# Patient Record
Sex: Female | Born: 1980 | Race: White | Hispanic: No | Marital: Married | Smoking: Never smoker
Health system: Southern US, Community
[De-identification: ages and names within clinical notes are randomized; demographics above are authoritative.]

## PROBLEM LIST (undated history)

## (undated) DIAGNOSIS — R000 Tachycardia, unspecified: Secondary | ICD-10-CM

## (undated) DIAGNOSIS — J45990 Exercise induced bronchospasm: Secondary | ICD-10-CM

## (undated) DIAGNOSIS — G44209 Tension-type headache, unspecified, not intractable: Secondary | ICD-10-CM

## (undated) DIAGNOSIS — J45909 Unspecified asthma, uncomplicated: Secondary | ICD-10-CM

## (undated) HISTORY — DX: Tension-type headache, unspecified, not intractable: G44.209

## (undated) HISTORY — DX: Exercise induced bronchospasm: J45.990

## (undated) HISTORY — DX: Unspecified asthma, uncomplicated: J45.909

## (undated) HISTORY — DX: Tachycardia, unspecified: R00.0

---

## 2002-11-27 HISTORY — PX: WISDOM TOOTH EXTRACTION: SHX21

## 2005-11-27 DIAGNOSIS — J45990 Exercise induced bronchospasm: Secondary | ICD-10-CM

## 2005-11-27 HISTORY — DX: Exercise induced bronchospasm: J45.990

## 2006-01-21 ENCOUNTER — Emergency Department (HOSPITAL_COMMUNITY): Admission: EM | Admit: 2006-01-21 | Discharge: 2006-01-21 | Payer: Self-pay | Admitting: Emergency Medicine

## 2006-02-20 ENCOUNTER — Other Ambulatory Visit: Admission: RE | Admit: 2006-02-20 | Discharge: 2006-02-20 | Payer: Self-pay | Admitting: Obstetrics & Gynecology

## 2006-11-27 HISTORY — PX: FINGER FRACTURE SURGERY: SHX638

## 2006-12-23 ENCOUNTER — Emergency Department (HOSPITAL_COMMUNITY): Admission: EM | Admit: 2006-12-23 | Discharge: 2006-12-23 | Payer: Self-pay | Admitting: Emergency Medicine

## 2008-01-28 ENCOUNTER — Other Ambulatory Visit: Admission: RE | Admit: 2008-01-28 | Discharge: 2008-01-28 | Payer: Self-pay | Admitting: Obstetrics & Gynecology

## 2009-02-23 ENCOUNTER — Other Ambulatory Visit: Admission: RE | Admit: 2009-02-23 | Discharge: 2009-02-23 | Payer: Self-pay | Admitting: Obstetrics & Gynecology

## 2013-06-10 ENCOUNTER — Encounter: Payer: Self-pay | Admitting: Certified Nurse Midwife

## 2013-06-19 ENCOUNTER — Ambulatory Visit: Payer: Self-pay | Admitting: Certified Nurse Midwife

## 2013-07-01 ENCOUNTER — Ambulatory Visit: Payer: Self-pay | Admitting: Certified Nurse Midwife

## 2013-07-01 ENCOUNTER — Other Ambulatory Visit: Payer: Self-pay | Admitting: Certified Nurse Midwife

## 2013-07-01 NOTE — Telephone Encounter (Signed)
AEX scheduled for 08/12/13 #84/0rf's sent through to last pt until AEX.

## 2013-08-08 ENCOUNTER — Encounter: Payer: Self-pay | Admitting: Certified Nurse Midwife

## 2013-08-11 ENCOUNTER — Encounter: Payer: Self-pay | Admitting: Certified Nurse Midwife

## 2013-08-12 ENCOUNTER — Ambulatory Visit: Payer: Self-pay | Admitting: Certified Nurse Midwife

## 2013-08-13 ENCOUNTER — Encounter: Payer: Self-pay | Admitting: Gynecology

## 2013-08-13 ENCOUNTER — Ambulatory Visit (INDEPENDENT_AMBULATORY_CARE_PROVIDER_SITE_OTHER): Payer: BC Managed Care – PPO | Admitting: Gynecology

## 2013-08-13 VITALS — BP 102/68 | HR 78 | Resp 18 | Ht 65.5 in | Wt 195.0 lb

## 2013-08-13 DIAGNOSIS — Z309 Encounter for contraceptive management, unspecified: Secondary | ICD-10-CM

## 2013-08-13 DIAGNOSIS — Z01419 Encounter for gynecological examination (general) (routine) without abnormal findings: Secondary | ICD-10-CM

## 2013-08-13 DIAGNOSIS — Z0000 Encounter for general adult medical examination without abnormal findings: Secondary | ICD-10-CM

## 2013-08-13 DIAGNOSIS — Z124 Encounter for screening for malignant neoplasm of cervix: Secondary | ICD-10-CM

## 2013-08-13 MED ORDER — LEVONORGESTREL-ETHINYL ESTRAD 0.1-20 MG-MCG PO TABS: 1.0000 | ORAL_TABLET | Freq: Every day | ORAL | Status: DC

## 2013-08-13 NOTE — Progress Notes (Signed)
32 y.o. Married Caucasian female   [id number] here for annual exam. Pt is currently sexually active.  Pt is considering pregnancy for next year.  Pt states that off ocp cycles are regular.  Pt denies dyspareunia.    Patient's last menstrual period was 07/04/2013.          Sexually active: yes  The current method of family planning is OCP (estrogen/progesterone).    Exercising: yes  run, cardio, body pump 2-3 days/wk varies Last pap: 05/25/2010 Alcohol:  Depends  Tobacco: no BSE: no  Hgb:  14.1 ; Urine: unable to void.    Health Maintenance  Topic Date Due  . Pap Smear  03/15/2001  . Influenza Vaccine  06/27/2013  . Tetanus/tdap  06/19/2023    Family History  Problem Relation Age of Onset  . Hypertension Mother   . Heart disease Mother   . Diabetes Father   . Diabetes Maternal Grandmother   . Osteoporosis Paternal Grandmother     There are no active problems to display for this patient.   Past Medical History  Diagnosis Date  . Exercise-induced asthma 2007    Past Surgical History  Procedure Laterality Date  . Finger fracture surgery  2008    Right    Allergies: Azithromycin  Current Outpatient Prescriptions  Medication Sig Dispense Refill  . albuterol (PROAIR HFA) 108 (90 BASE) MCG/ACT inhaler Inhale 2 puffs into the lungs every 6 (six) hours as needed for wheezing.      . Cetirizine HCl (ZYRTEC PO) Take by mouth.      . ORSYTHIA 0.1-20 MG-MCG tablet TAKE 1 TABLET BY MOUTH EVERY DAY  84 tablet  0   No current facility-administered medications for this visit.    ROS: Pertinent items are noted in HPI.  Exam:    BP 102/68  Pulse 78  Resp 18  Ht 5' 5.5" (1.664 m)  Wt 195 lb (88.451 kg)  BMI 31.94 kg/m2  LMP 07/04/2013 Weight change: @WEIGHTCHANGE @ Last 3 height recordings:  Ht Readings from Last 3 Encounters:  08/13/13 5' 5.5" (1.664 m)   General appearance: alert, cooperative and appears stated age Head: Normocephalic, without obvious abnormality,  atraumatic Neck: no adenopathy, no carotid bruit, no JVD, supple, symmetrical, trachea midline and thyroid not enlarged, symmetric, no tenderness/mass/nodules Lungs: clear to auscultation bilaterally Breasts: normal appearance, no masses or tenderness Heart: regular rate and rhythm, S1, S2 normal, no murmur, click, rub or gallop Abdomen: soft, non-tender; bowel sounds normal; no masses,  no organomegaly Extremities: extremities normal, atraumatic, no cyanosis or edema Skin: Skin color, texture, turgor normal. No rashes or lesions Lymph nodes: Cervical, supraclavicular, and axillary nodes normal. no inguinal nodes palpated Neurologic: Grossly normal   Pelvic: External genitalia:  no lesions              Urethra: normal appearing urethra with no masses, tenderness or lesions              Bartholins and Skenes: normal                 Vagina: normal appearing vagina with normal color and discharge, no lesions              Cervix: normal appearance              Pap taken: yes        Bimanual Exam:  Uterus:  uterus is normal size, shape, consistency and nontender  Adnexa:    normal adnexa in size, nontender and no masses                                      Rectovaginal: Confirms                                      Anus:  normal sphincter tone, no lesions  A: well woman no contraindication to continue use of oral contraceptives Contraceptive management     P: pap smear with HRHPV counseled on breast self exam, adequate intake of calcium and vitamin D, diet and exercise return annually or prn   An After Visit Summary was printed and given to the patient.

## 2013-08-14 LAB — RUBELLA SCREEN: Rubella: 11.4 Index — ABNORMAL HIGH (ref <0.90)

## 2013-08-18 ENCOUNTER — Telehealth: Payer: Self-pay | Admitting: *Deleted

## 2013-08-18 LAB — IPS PAP TEST WITH HPV

## 2013-08-18 NOTE — Telephone Encounter (Signed)
Left Message To Call Back  

## 2013-08-18 NOTE — Telephone Encounter (Signed)
Message copied by Lorraine Lax on Mon Aug 18, 2013 10:21 AM ------      Message from: Douglass Rivers      Created: Fri Aug 15, 2013  7:39 PM       Inform imune ------

## 2013-08-21 NOTE — Telephone Encounter (Signed)
Patient notified see labs 

## 2013-12-11 ENCOUNTER — Telehealth: Payer: Self-pay | Admitting: Certified Nurse Midwife

## 2013-12-11 NOTE — Telephone Encounter (Signed)
Spoke with pharmacy and rx was prescribe by Sigmund HazelLisa Miller.   Spoke with pt ans was notified that she has to get medication from PCP as agreed on 01/29/2013. (paper chart)

## 2013-12-11 NOTE — Telephone Encounter (Signed)
Pt is requesting a refill for her inhaler(pro air). She states DL has prescribed this for her before and she only comes here for care.

## 2014-04-15 ENCOUNTER — Ambulatory Visit (INDEPENDENT_AMBULATORY_CARE_PROVIDER_SITE_OTHER): Payer: BC Managed Care – PPO | Admitting: Gynecology

## 2014-04-15 ENCOUNTER — Encounter: Payer: Self-pay | Admitting: Gynecology

## 2014-04-15 VITALS — BP 100/64 | HR 72 | Resp 16 | Ht 65.5 in | Wt 204.0 lb

## 2014-04-15 DIAGNOSIS — Z3169 Encounter for other general counseling and advice on procreation: Secondary | ICD-10-CM

## 2014-04-15 NOTE — Progress Notes (Signed)
Pt here for preconception counseling, she is finishing her last pills this week and is due for menses.  Pt believes that her cycles are regular.  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 Pt repeats cycles before ocp were regular, she denies dyspareunia, or dysmenorrhea.  She has never attempted pregnancy in the past.  Pt reports eating a balanced diet, she does not do regular exercise. Her husband is healthy, no issues with ejaculation.  He is on no medications, has no medical problems such as diabetes or hypertension., he has never fathered a pregnancy.  He has no exposure to chemicals in his job.   We reviewed anticipated ovulation based on 28d cycle, coital frequency, medications and activites to avoid in the 2nd half of her cycle as well as expected fecundity rate based on age. We do not recommend BBT charting or ovulation predictor kits at this point. We would intervene after 7245m of actively trying as long as her cycles are regular, if they become markedly irregluar, she should return before that time. She can take an otc women's vitamin. Questions addressed She was asked to call with +UPT  7979m spent counseling, >50% face to face

## 2014-04-15 NOTE — Patient Instructions (Addendum)
2nd half of cycle-no alcohol, motrin,  hot tubs, saunas Well balanced diet, otc prenatals or women's multi Coital frequency every 3d, no ovulation kits, temp chartingPreparing for Pregnancy Preparing for pregnancy (preconceptual care) by getting counseling and information from your caregiver before getting pregnant is a good idea. It will help you and your baby have a better chance to have a healthy, safe pregnancy and delivery of your baby. Make an appointment with your caregiver to talk about your health, medical, and family history and how to prepare yourself before getting pregnant. Your caregiver will do a complete physical exam and a Pap test. They will want to know:  About you, your spouse or partner, and your family's medical and genetic history.  If you are eating a balanced diet and drinking enough fluids.  What vitamins and mineral supplements you are taking. This includes taking folic acid before getting pregnant to help prevent birth defects.  What medications you are taking including prescription, over-the-counter and herbal medications.  If there is any substance abuse like alcohol, smoking, and illegal drugs.  If there is any mental or physical domestic violence.  If there is any risk of sexually transmitted disease between you and your partner.  What immunizations and vaccinations you have had and what you may need before getting pregnant.  If you should get tested for HIV infection.  If there is any exposure to chemical or toxic substances at home or work.  If there are medical problems you have that need to be treated and kept under control before getting pregnant such as diabetes, high blood pressure or others.  If there were any past surgeries, pregnancies and problems with them.  What your current weight is and to set a goal as to how much weight you should gain while pregnant. Also, they will check if you should lose or gain weight before getting pregnant.  What  is your exercise routine and what it is safe when you are pregnant.  If there are any physical disabilities that need to be addressed.  About spacing your pregnancies when there are other children.  If there is a financial problem that may affect you having a child. After talking about the above points with your caregiver, your caregiver will give you advice on how to help treat and work with you on solving any issues, if necessary, before getting pregnant. The goal is to have a healthy and safe pregnancy for you and your baby. You should keep an accurate record of your menstrual periods because it will help in determining your due date. Immunizations that you should have before getting pregnant:   Regular measles, [place] measles (rubella) and mumps.  Tetanus and diphtheria.  Chickenpox, if not immune.  Herpes zoster (Varicella) if not immune.  Human papilloma virus vaccine (HPV) between the age of 65 and 80 years old.  Hepatitis A vaccine.  Hepatitis B vaccine.  Influenza vaccine.  Pneumococcal vaccine (pneumonia). You should avoid getting pregnant for one month after getting vaccinated with a live virus vaccine such as [place] measles (rubella) which is in the MMR (Measles, Mumps and Rubella) vaccine. Other immunizations may be necessary depending on where you live, such as malaria. Ask your caregiver if any other immunizations are needed for you. HOME CARE INSTRUCTIONS   Follow the advice of your caregiver.  Before getting pregnant:  Begin taking vitamins, supplements, and 0.4 milligrams folic acid daily.  Get your immunizations up-to-date.  Get help from a nutrition counselor if  you do not understand what a balanced diet is, need help with a special medical diet or if you need help to lose or gain weight.  Begin exercising.  Stop smoking, taking illegal drugs, and drinking alcoholic beverages.  Get counseling if there is and type of domestic violence.  Get checked for  sexually transmitted diseases including HIV.  Get any medical problems under control (diabetes, high blood pressure, convulsions, asthma or others).  Resolve any financial concerns or create a plan to do so.  Be sure you and your spouse or partner are ready to have a baby.  Keep an accurate record of your menstrual periods. Document Released: 10/26/2008 Document Revised: 09/03/2013 Document Reviewed: 10/26/2008 [facility] Patient Information 2014 [place].

## 2014-07-03 ENCOUNTER — Encounter: Payer: Self-pay | Admitting: Certified Nurse Midwife

## 2014-07-03 ENCOUNTER — Ambulatory Visit (INDEPENDENT_AMBULATORY_CARE_PROVIDER_SITE_OTHER): Payer: BC Managed Care – PPO | Admitting: Certified Nurse Midwife

## 2014-07-03 VITALS — BP 120/70 | HR 70 | Resp 16 | Ht 65.5 in | Wt 211.0 lb

## 2014-07-03 DIAGNOSIS — N912 Amenorrhea, unspecified: Secondary | ICD-10-CM

## 2014-07-03 DIAGNOSIS — Z3201 Encounter for pregnancy test, result positive: Secondary | ICD-10-CM

## 2014-07-03 LAB — POCT URINE PREGNANCY: Preg Test, Ur: POSITIVE

## 2014-07-03 NOTE — Progress Notes (Signed)
  33 y.o.Married Caucasian female presents with no menses sinceJuly 5. Patient was trying for pregnancy and had stopped contraception. Currently on prenatal vitamins OTC. Patient was taking Zyrtec for allergies and will stop use. Patient had small amount of alcohol earlier in the week before positive pregnancy test. Non smoker. Spouse excited. Denies vaginal bleeding or cramping. Some breast tenderness, no nausea, no vomiting, some fatigue. No problems today.  O: Healthy female WDWN Positive UPT Rubella immune  A: Amenorrhea with positive pregnancy test at 5wk 3 d per LMP on 06/01/14[facility] pregnancy  P:Discussed nutritional needs of pregnancy and foods to avoid. Discussed importance of prenatal care and establish care by 8 - 9 weeks. Given provider list with information. Offered PUS here for viability, patient declined, would rather have done once she starts OB care. Reviewed early pregnancy warning signs and need to evaluate. Questions addressed at length. Patient to call when appointment is scheduled with OB practice.     25 minutes of time spent with patient in face to face counseling.

## 2014-07-04 NOTE — Patient Instructions (Signed)
Prenatal Care  WHAT IS PRENATAL CARE?  Prenatal care means health care during your pregnancy, before your baby is born. It is very important to take care of yourself and your baby during your pregnancy by:   Getting early prenatal care. If you know you are pregnant, or think you might be pregnant, call your health care provider as soon as possible. Schedule a visit for a prenatal exam.  Getting regular prenatal care. Follow your health care provider's schedule for blood and other necessary tests. Do not miss appointments.  Doing everything you can to keep yourself and your baby healthy during your pregnancy.  Getting complete care. Prenatal care should include evaluation of the medical, dietary, educational, psychological, and social needs of you and your significant other. The medical and genetic history of your family and the family of your baby's father should be discussed with your health care provider.  Discussing with your health care provider:  Prescription, over-the-counter, and herbal medicines that you take.  Any history of substance abuse, alcohol use, smoking, and illegal drug use.  Any history of domestic abuse and violence.  Immunizations you have received.  Your nutrition and diet.  The amount of exercise you do.  Any environmental and occupational hazards to which you are exposed.  History of sexually transmitted infections for both you and your partner.  Previous pregnancies you have had. WHY IS PRENATAL CARE SO IMPORTANT?  By regularly seeing your health care provider, you help ensure that problems can be identified early so that they can be treated as soon as possible. Other problems might be prevented. Many studies have shown that early and regular prenatal care is important for the health of mothers and their babies.  HOW CAN I TAKE CARE OF MYSELF WHILE I AM PREGNANT?  Here are ways to take care of yourself and your baby:   Start or continue taking your  multivitamin with 400 micrograms (mcg) of folic acid every day.  Get early and regular prenatal care. It is very important to see a health care provider during your pregnancy. Your health care provider will check at each visit to make sure that you and your baby are healthy. If there are any problems, action can be taken right away to help you and your baby.  Eat a healthy diet that includes:  Fruits.  Vegetables.  Foods low in saturated fat.  Whole grains.  Calcium-rich foods, such as milk, yogurt, and hard cheeses.  Drink 6-8 glasses of liquids a day.  Unless your health care provider tells you not to, try to be physically active for 30 minutes, most days of the week. If you are pressed for time, you can get your activity in through 10-minute segments, three times a day.  Do not smoke, drink alcohol, or use drugs. These can cause long-term damage to your baby. Talk with your health care provider about steps to take to stop smoking. Talk with a member of your faith community, a counselor, a trusted friend, or your health care provider if you are concerned about your alcohol or drug use.  Ask your health care provider before taking any medicine, even over-the-counter medicines. Some medicines are not safe to take during pregnancy.  Get plenty of rest and sleep.  Avoid hot tubs and saunas during pregnancy.  Do not have X-rays taken unless absolutely necessary and with the recommendation of your health care provider. A lead shield can be placed on your abdomen to protect your baby when   X-rays are taken in other parts of your body.  Do not empty the cat litter when you are pregnant. It may contain a parasite that causes an infection called toxoplasmosis, which can cause birth defects. Also, use gloves when working in garden areas used by cats.  Do not eat uncooked or undercooked meats or fish.  Do not eat soft, mold-ripened cheeses (Brie, Camembert, and chevre) or soft, blue-veined  cheese (Danish blue and Roquefort).  Stay away from toxic chemicals like:  Insecticides.  Solvents (some cleaners or paint thinners).  Lead.  Mercury.  Sexual intercourse may continue until the end of the pregnancy, unless you have a medical problem or there is a problem with the pregnancy and your health care provider tells you not to.  Do not wear high-heel shoes, especially during the second half of the pregnancy. You can lose your balance and fall.  Do not take long trips, unless absolutely necessary. Be sure to see your health care provider before going on the trip.  Do not sit in one position for more than 2 hours when on a trip.  Take a copy of your medical records when going on a trip. Know where a hospital is located in the city you are visiting, in case of an emergency.  Most dangerous household products will have pregnancy warnings on their labels. Ask your health care provider about products if you are unsure.  Limit or eliminate your caffeine intake from coffee, tea, sodas, medicines, and chocolate.  Many women continue working through pregnancy. Staying active might help you stay healthier. If you have a question about the safety or the hours you work at your particular job, talk with your health care provider.  Get informed:  Read books.  Watch videos.  Go to childbirth classes for you and your significant other.  Talk with experienced moms.  Ask your health care provider about childbirth education classes for you and your partner. Classes can help you and your partner prepare for the birth of your baby.  Ask about a baby doctor (pediatrician) and methods and pain medicine for labor, delivery, and possible cesarean delivery. HOW OFTEN SHOULD I SEE MY HEALTH CARE PROVIDER DURING PREGNANCY?  Your health care provider will give you a schedule for your prenatal visits. You will have visits more often as you get closer to the end of your pregnancy. An average  pregnancy lasts about 40 weeks.  A typical schedule includes visiting your health care provider:   About once each month during your first 6 months of pregnancy.  Every 2 weeks during the next 2 months.  Weekly in the last month, until the delivery date. Your health care provider will probably want to see you more often if:  You are older than 35 years.  Your pregnancy is high risk because you have certain health problems or problems with the pregnancy, such as:  Diabetes.  High blood pressure.  The baby is not growing on schedule, according to the dates of the pregnancy. Your health care provider will do special tests to make sure you and your baby are not having any serious problems. WHAT HAPPENS DURING PRENATAL VISITS?   At your first prenatal visit, your health care provider will do a physical exam and talk to you about your health history and the health history of your partner and your family. Your health care provider will be able to tell you what date to expect your baby to be born on.  Your   first physical exam will include checks of your blood pressure, measurements of your height and weight, and an exam of your pelvic organs. Your health care provider will do a Pap test if you have not had one recently and will do cultures of your cervix to make sure there is no infection.  At each prenatal visit, there will be tests of your blood, urine, blood pressure, weight, and the progress of the baby will be checked.  At your later prenatal visits, your health care provider will check how you are doing and how your baby is developing. You may have a number of tests done as your pregnancy progresses.  Ultrasound exams are often used to check on your baby's growth and health.  You may have more urine and blood tests, as well as special tests, if needed. These may include amniocentesis to examine fluid in the pregnancy sac, stress tests to check how the baby responds to contractions, or a  biophysical profile to measure your baby's well-being. Your health care provider will explain the tests and why they are necessary.  You should be tested for high blood sugar (gestational diabetes) between the 24th and 28th weeks of your pregnancy.  You should discuss with your health care provider your plans to breastfeed or bottle-feed your baby.  Each visit is also a chance for you to learn about staying healthy during pregnancy and to ask questions. Document Released: 11/16/2003 Document Revised: 11/18/2013 Document Reviewed: 01/28/2014 [employer] Patient Information 2015 [employer]. This information is not intended to replace advice given to you by your health care provider. Make sure you discuss any questions you have with your health care provider.  

## 2014-07-06 NOTE — Progress Notes (Signed)
Reviewed personally.  M. Suzanne Minka Knight, MD.  

## 2014-07-07 ENCOUNTER — Telehealth: Payer: Self-pay | Admitting: Gynecology

## 2014-07-07 NOTE — Telephone Encounter (Signed)
Pt calling with questions about allergy medications since she had a positive pregnancy test.

## 2014-07-07 NOTE — Telephone Encounter (Signed)
Spoke with patient. Patient states that when she was seen with Linda Norton she was told to stop taking Zyrtec. Patient states "[place] am fine but I came up to [place] and my allergies are really bad." Patient has been using nasal saline as suggested but would like to know what else she is able to do. "I want to know what to take and is it okay. I know it isn't good for you but if I had to take something what is best?" Advised patient would send a message to Linda Norton CNM and give patient a call back with further recommendations/instructions. Patient agreeable.

## 2014-07-07 NOTE — Telephone Encounter (Signed)
Spoke with patient. Advised of message as seen below from Deborah S. Leonard CNM. Patient is agreeable and verbalizes understanding.  Routing to provider for final review. Patient agreeable to disposition. Will close encounter   

## 2014-07-07 NOTE — Telephone Encounter (Signed)
Patient can try children's Claritin chewable per OTC instructions to see if this will help.

## 2014-07-30 LAB — OB RESULTS CONSOLE ABO/RH: RH Type: POSITIVE

## 2014-07-30 LAB — OB RESULTS CONSOLE HIV ANTIBODY (ROUTINE TESTING): HIV: NONREACTIVE

## 2014-07-30 LAB — OB RESULTS CONSOLE HEPATITIS B SURFACE ANTIGEN: HEP B S AG: NEGATIVE

## 2014-07-30 LAB — OB RESULTS CONSOLE RPR: RPR: NONREACTIVE

## 2014-07-30 LAB — OB RESULTS CONSOLE GC/CHLAMYDIA
CHLAMYDIA, DNA PROBE: NEGATIVE
Gonorrhea: NEGATIVE

## 2014-09-18 ENCOUNTER — Telehealth: Payer: Self-pay | Admitting: Gynecology

## 2014-09-18 NOTE — Telephone Encounter (Signed)
Routing to Dr.Lathrop as FYI.  Routing to provider for final review. Patient agreeable to disposition. Will close encounter

## 2014-09-18 NOTE — Telephone Encounter (Signed)
Pt is calling because she forgot to let dr lathrop know that [facility] and is seeing Dr Vickey SagesAtkins.

## 2014-09-28 ENCOUNTER — Encounter: Payer: Self-pay | Admitting: Certified Nurse Midwife

## 2014-11-27 NOTE — L&D Delivery Note (Signed)
Delivery Note At 11:02 PM a viable female was delivered via Vaginal, Spontaneous Delivery (Presentation: ; Occiput Anterior).  APGAR: , ; weight  .   Placenta status: Intact, Spontaneous.  Cord:  with the following complications: None.  Cord pH: not sent   Anesthesia: Local  Episiotomy: None Lacerations: 2nd degree Suture Repair: 3.0 vicryl rapide Est. Blood Loss (mL):  300  Mom to postpartum.  Baby to Couplet care / Skin to Skin.  Meriel PicaHOLLAND,Kasch Borquez M 02/26/2015, 11:23 PM

## 2015-02-01 LAB — OB RESULTS CONSOLE GBS: GBS: NEGATIVE

## 2015-02-26 ENCOUNTER — Inpatient Hospital Stay (HOSPITAL_COMMUNITY)
Admission: AD | Admit: 2015-02-26 | Discharge: 2015-02-28 | DRG: 775 | Disposition: A | Discharge status: Home / Self Care | Payer: BC Managed Care – PPO | Source: Ambulatory Visit | Attending: Obstetrics & Gynecology | Admitting: Obstetrics & Gynecology

## 2015-02-26 ENCOUNTER — Encounter (HOSPITAL_COMMUNITY): Payer: Self-pay

## 2015-02-26 DIAGNOSIS — Z833 Family history of diabetes mellitus: Secondary | ICD-10-CM

## 2015-02-26 DIAGNOSIS — Z3A40 40 weeks gestation of pregnancy: Secondary | ICD-10-CM | POA: Diagnosis present

## 2015-02-26 DIAGNOSIS — Z8249 Family history of ischemic heart disease and other diseases of the circulatory system: Secondary | ICD-10-CM

## 2015-02-26 DIAGNOSIS — IMO0001 Reserved for inherently not codable concepts without codable children: Secondary | ICD-10-CM

## 2015-02-26 DIAGNOSIS — O9989 Other specified diseases and conditions complicating pregnancy, childbirth and the puerperium: Secondary | ICD-10-CM | POA: Diagnosis present

## 2015-02-26 LAB — TYPE AND SCREEN
ABO/RH(D): O POS
Antibody Screen: NEGATIVE

## 2015-02-26 LAB — CBC
HEMATOCRIT: 38.7 % (ref 36.0–46.0)
HEMOGLOBIN: 13.1 g/dL (ref 12.0–15.0)
MCH: 28.1 pg (ref 26.0–34.0)
MCHC: 33.9 g/dL (ref 30.0–36.0)
MCV: 82.9 fL (ref 78.0–100.0)
Platelets: 226 10*3/uL (ref 150–400)
RBC: 4.67 MIL/uL (ref 3.87–5.11)
RDW: 15.4 % (ref 11.5–15.5)
WBC: 11.8 10*3/uL — ABNORMAL HIGH (ref 4.0–10.5)

## 2015-02-26 LAB — RPR: RPR Ser Ql: NONREACTIVE

## 2015-02-26 LAB — ABO/RH: ABO/RH(D): O POS

## 2015-02-26 MED ORDER — LACTATED RINGERS IV SOLN
INTRAVENOUS | Status: DC
  Administered 2015-02-26: 18:00:00 via INTRAVENOUS

## 2015-02-26 MED ORDER — BUTORPHANOL TARTRATE 1 MG/ML IJ SOLN
INTRAMUSCULAR | Status: AC
  Administered 2015-02-26: 1 mg
  Filled 2015-02-26: qty 1

## 2015-02-26 MED ORDER — FLEET ENEMA 7-19 GM/118ML RE ENEM: 1.0000 | ENEMA | RECTAL | Status: DC | PRN

## 2015-02-26 MED ORDER — CITRIC ACID-SODIUM CITRATE 334-500 MG/5ML PO SOLN
30.0000 mL | ORAL | Status: DC | PRN
Start: 2015-02-26 — End: 2015-02-27

## 2015-02-26 MED ORDER — OXYTOCIN 40 UNITS IN LACTATED RINGERS INFUSION - SIMPLE MED
1.0000 m[IU]/min | INTRAVENOUS | Status: DC
  Administered 2015-02-26: 2 m[IU]/min via INTRAVENOUS
  Filled 2015-02-26: qty 1000

## 2015-02-26 MED ORDER — TERBUTALINE SULFATE 1 MG/ML IJ SOLN: 0.2500 mg | Freq: Once | INTRAMUSCULAR | Status: AC | PRN

## 2015-02-26 MED ORDER — LACTATED RINGERS IV SOLN: 500.0000 mL | INTRAVENOUS | Status: DC | PRN

## 2015-02-26 MED ORDER — ONDANSETRON HCL 4 MG/2ML IJ SOLN: 4.0000 mg | Freq: Four times a day (QID) | INTRAMUSCULAR | Status: DC | PRN

## 2015-02-26 MED ORDER — BUTORPHANOL TARTRATE 1 MG/ML IJ SOLN: 1.0000 mg | Freq: Once | INTRAMUSCULAR | Status: DC

## 2015-02-26 MED ORDER — ACETAMINOPHEN 325 MG PO TABS
650.0000 mg | ORAL_TABLET | ORAL | Status: DC | PRN
Start: 2015-02-26 — End: 2015-02-27

## 2015-02-26 MED ORDER — OXYTOCIN 40 UNITS IN LACTATED RINGERS INFUSION - SIMPLE MED: 62.5000 mL/h | INTRAVENOUS | Status: DC

## 2015-02-26 MED ORDER — LIDOCAINE HCL (PF) 1 % IJ SOLN
30.0000 mL | INTRAMUSCULAR | Status: DC | PRN
  Administered 2015-02-26: 30 mL via SUBCUTANEOUS
  Filled 2015-02-26: qty 30

## 2015-02-26 MED ORDER — OXYCODONE-ACETAMINOPHEN 5-325 MG PO TABS: 1.0000 | ORAL_TABLET | ORAL | Status: DC | PRN

## 2015-02-26 MED ORDER — OXYTOCIN BOLUS FROM INFUSION
500.0000 mL | INTRAVENOUS | Status: DC
  Administered 2015-02-26: 500 mL via INTRAVENOUS

## 2015-02-26 MED ORDER — OXYCODONE-ACETAMINOPHEN 5-325 MG PO TABS
2.0000 | ORAL_TABLET | ORAL | Status: DC | PRN
Start: 2015-02-26 — End: 2015-02-27

## 2015-02-26 NOTE — Progress Notes (Signed)
Dr. Marcelle OverlieHolland called and notified of pt request to hold Pitocin, notified pt requesting to use breast pump to stimulate uc's,

## 2015-02-26 NOTE — MAU Note (Signed)
PT  SAYS SHE AWOKE AT 0230  AND PANTS  WERE WET.   VE IN  OFFICE  3 CM.   DENIES HSV AND MRSA.  GBS- NEG  FEELS  SOME UC

## 2015-02-26 NOTE — H&P (Signed)
Linda DupontChristina Norton is a 34 y.o. female presenting for SROM/labor. Maternal Medical History:  Reason for admission: Rupture of membranes.   Contractions: Onset was 3-5 hours ago.   Frequency: irregular.   Perceived severity is moderate.    Fetal activity: Perceived fetal activity is normal.   Last perceived fetal movement was within the past hour.      OB History    Gravida Para Term Preterm AB TAB SAB Ectopic Multiple Living   1 0 0 0 0 0 0 0 0 0      Past Medical History  Diagnosis Date  . Exercise-induced asthma 2007   Past Surgical History  Procedure Laterality Date  . Finger fracture surgery  2008    Right   Family History: family history includes Diabetes in her father and maternal grandmother; Heart disease in her mother; Hypertension in her mother; Osteoporosis in her paternal grandmother. Social History:  reports that she has never smoked. She has never used smokeless tobacco. She reports that she does not drink alcohol or use illicit drugs.   Prenatal Transfer Tool  Maternal Diabetes: No Genetic Screening: Normal Maternal Ultrasounds/Referrals: Normal Fetal Ultrasounds or other Referrals:  None Maternal Substance Abuse:  No Significant Maternal Medications:  None Significant Maternal Lab Results:  None Other Comments:  None  ROS  Dilation: 4 Effacement (%): 70 Exam by:: Lucas MallowKlashley, RN Height 5\' 5"  (1.651 m), weight 255 lb (115.667 kg), last menstrual period 06/01/2014. Exam Physical Exam  Constitutional: She is oriented to person, place, and time. She appears well-developed and well-nourished.  HENT:  Head: Normocephalic and atraumatic.  Neck: Normal range of motion. Neck supple.  Cardiovascular: Normal rate and regular rhythm.   Respiratory: Effort normal and breath sounds normal.  GI:  Term FH, FHR 148  Genitourinary:  4/vtx/clr AF  Musculoskeletal: Normal range of motion.  Neurological: She is alert and oriented to person, place, and time.     Prenatal labs: ABO, Rh: O/Positive/-- (09/03 0000) Antibody:   Rubella:   RPR: Nonreactive (09/03 0000)  HBsAg: Negative (09/03 0000)  HIV: Non-reactive (09/03 0000)  GBS: Negative (03/07 0000)   Assessment/Plan: Term IUP, SROM + early labor   Derico Mitton M 02/26/2015, 7:29 AM

## 2015-02-26 NOTE — Progress Notes (Signed)
Called by RN re hypotonic labor pattern, rec pit per protocol, pt declines

## 2015-02-26 NOTE — Progress Notes (Signed)
Reported to Dr. Marcelle OverlieHolland that patient has UCs every 1.5 to 2.5 min with little resting time in between. Dr. Marcelle OverlieHolland ordered to turn pitocin off and maintain continuous fetal monitoring.

## 2015-02-26 NOTE — MAU Note (Signed)
Leaking fluid, unsure if still leaking. Bloody show. Mild abdominal cramping. Positive fetal movement. Dilated 3 cm in office yesterday.

## 2015-02-26 NOTE — Progress Notes (Signed)
Tele EFM monitor applied for patient to ambulate in hallway and room; birthing ball also brought to room per patient request; no other questions or concerns at this time

## 2015-02-27 ENCOUNTER — Encounter (HOSPITAL_COMMUNITY): Payer: Self-pay | Admitting: *Deleted

## 2015-02-27 LAB — CBC
HCT: 33.5 % — ABNORMAL LOW (ref 36.0–46.0)
Hemoglobin: 11.3 g/dL — ABNORMAL LOW (ref 12.0–15.0)
MCH: 27.8 pg (ref 26.0–34.0)
MCHC: 33.7 g/dL (ref 30.0–36.0)
MCV: 82.5 fL (ref 78.0–100.0)
Platelets: 198 10*3/uL (ref 150–400)
RBC: 4.06 MIL/uL (ref 3.87–5.11)
RDW: 15.5 % (ref 11.5–15.5)
WBC: 16.1 10*3/uL — ABNORMAL HIGH (ref 4.0–10.5)

## 2015-02-27 MED ORDER — SENNOSIDES-DOCUSATE SODIUM 8.6-50 MG PO TABS
2.0000 | ORAL_TABLET | ORAL | Status: DC
  Administered 2015-02-27: 2 via ORAL
  Filled 2015-02-27: qty 2

## 2015-02-27 MED ORDER — BISACODYL 10 MG RE SUPP: 10.0000 mg | Freq: Every day | RECTAL | Status: DC | PRN

## 2015-02-27 MED ORDER — MEASLES, MUMPS & RUBELLA VAC ~~LOC~~ INJ
0.5000 mL | INJECTION | Freq: Once | SUBCUTANEOUS | Status: DC
  Filled 2015-02-27: qty 0.5

## 2015-02-27 MED ORDER — ACETAMINOPHEN 325 MG PO TABS: 650.0000 mg | ORAL_TABLET | ORAL | Status: DC | PRN

## 2015-02-27 MED ORDER — PRENATAL MULTIVITAMIN CH
1.0000 | ORAL_TABLET | Freq: Every day | ORAL | Status: DC
  Filled 2015-02-27: qty 1

## 2015-02-27 MED ORDER — ONDANSETRON HCL 4 MG PO TABS: 4.0000 mg | ORAL_TABLET | ORAL | Status: DC | PRN

## 2015-02-27 MED ORDER — DIBUCAINE 1 % RE OINT: 1.0000 "application " | TOPICAL_OINTMENT | RECTAL | Status: DC | PRN

## 2015-02-27 MED ORDER — IBUPROFEN 800 MG PO TABS
800.0000 mg | ORAL_TABLET | Freq: Three times a day (TID) | ORAL | Status: DC | PRN
  Administered 2015-02-27: 800 mg via ORAL
  Filled 2015-02-27: qty 1

## 2015-02-27 MED ORDER — SIMETHICONE 80 MG PO CHEW: 80.0000 mg | CHEWABLE_TABLET | ORAL | Status: DC | PRN

## 2015-02-27 MED ORDER — OXYCODONE-ACETAMINOPHEN 5-325 MG PO TABS: 2.0000 | ORAL_TABLET | ORAL | Status: DC | PRN

## 2015-02-27 MED ORDER — OXYCODONE-ACETAMINOPHEN 5-325 MG PO TABS: 1.0000 | ORAL_TABLET | ORAL | Status: DC | PRN

## 2015-02-27 MED ORDER — BENZOCAINE-MENTHOL 20-0.5 % EX AERO
1.0000 "application " | INHALATION_SPRAY | CUTANEOUS | Status: DC | PRN
  Administered 2015-02-27: 1 via TOPICAL
  Filled 2015-02-27: qty 56

## 2015-02-27 MED ORDER — ALBUTEROL SULFATE (2.5 MG/3ML) 0.083% IN NEBU: 3.0000 mL | INHALATION_SOLUTION | Freq: Four times a day (QID) | RESPIRATORY_TRACT | Status: DC | PRN

## 2015-02-27 MED ORDER — WITCH HAZEL-GLYCERIN EX PADS: 1.0000 "application " | MEDICATED_PAD | CUTANEOUS | Status: DC | PRN

## 2015-02-27 MED ORDER — FLEET ENEMA 7-19 GM/118ML RE ENEM: 1.0000 | ENEMA | Freq: Every day | RECTAL | Status: DC | PRN

## 2015-02-27 MED ORDER — DIPHENHYDRAMINE HCL 25 MG PO CAPS: 25.0000 mg | ORAL_CAPSULE | Freq: Four times a day (QID) | ORAL | Status: DC | PRN

## 2015-02-27 MED ORDER — LANOLIN HYDROUS EX OINT: TOPICAL_OINTMENT | CUTANEOUS | Status: DC | PRN

## 2015-02-27 MED ORDER — ZOLPIDEM TARTRATE 5 MG PO TABS: 5.0000 mg | ORAL_TABLET | Freq: Every evening | ORAL | Status: DC | PRN

## 2015-02-27 MED ORDER — TETANUS-DIPHTH-ACELL PERTUSSIS 5-2.5-18.5 LF-MCG/0.5 IM SUSP
0.5000 mL | Freq: Once | INTRAMUSCULAR | Status: DC
Start: 2015-02-27 — End: 2015-02-27

## 2015-02-27 MED ORDER — ONDANSETRON HCL 4 MG/2ML IJ SOLN: 4.0000 mg | INTRAMUSCULAR | Status: DC | PRN

## 2015-02-27 NOTE — Lactation Note (Signed)
This note was copied from the chart of Boy Zakara Vasudevan. Lactation Consultation Note  Patient Name: Boy Terrence DupontChristina Bazar [id number]'s Date: 02/27/2015 Reason for consult: Initial assessment   Initial consult at 21 hours old; GA 38.5; BW 7#,0oz; voids-1; stools-2 since birth.  Mom reports feeding on left side for 30+ minutes prior to switching to right. Infant was on right breast when LC entered room semi-shallow latch.  Encouraged mom to break seal and re-latch.  Taught sandwiching of breast and asymmetrical latching technique.  Taught dad how to assist using tea cup hold and taught both parents how to flange bottom lip.  Infant kept tucking bottom lip.  Infant fed with a consistent pattern with deep latch and deep sucks, few swallows heard.  Taught hand expressions with return demonstration and observation of colostrum.  Mom was encouraged when she saw the colostrum and when we heard the swallows.  Lots basic teaching done.  Infant has been feeding for the past hour.  Twice noted strings of milk on nipple when infant came off breast during consult.    Encouraged parents to keep feeding with feeding cues.  Educated on cluster feeding, size of infant's stomach, and encouraged continued exclusive breastfeeding.  Educated on supply and demand and breast care.   Mom asked questions about pumping and building a milk reserve in freezer for returning to work in 4 months.  (Mom is a Runner, broadcasting/film/videoteacher).  Mom has a DEBP from a friend she is borrowing while she waits for her pump from insurance company to arrive. Mom has only been feeding in football position.  Encouraged mom to try cross-cradle position prior to discharge.  Discharge anticipated for tomorrow.   Lactation brochure given and informed of outpatient consults and hospital support group.  Encouraged to call for assistance with feedings as needed.     Maternal Data Has patient been taught Hand Expression?: Yes (colostrum observed with return demonstration) Does  the patient have breastfeeding experience prior to this delivery?: No  Feeding Feeding Type: Breast Fed Length of feed: 75 min  LATCH Score/Interventions Latch: Grasps breast easily, tongue down, lips flanged, rhythmical sucking. Intervention(s): Breast compression  Audible Swallowing: A few with stimulation Intervention(s): Skin to skin;Alternate breast massage  Type of Nipple: Everted at rest and after stimulation  Comfort (Breast/Nipple): Soft / non-tender     Hold (Positioning): No assistance needed to correctly position infant at breast. Intervention(s): Breastfeeding basics reviewed;Support Pillows;Skin to skin (encouraged mom to try cross-cradle position prior to discharge)  LATCH Score: 9  Lactation Tools Discussed/Used WIC Program: No   Consult Status Consult Status: Follow-up Date: 02/28/15 Follow-up type: In-patient    Lendon KaVann, Khalidah Herbold Walker 02/27/2015, 8:22 PM

## 2015-02-27 NOTE — Plan of Care (Signed)
Problem: Consults Goal: Birthing Suites Patient Information Press F2 to bring up selections list  Outcome: Completed/Met Date Met:  02/27/15  Pt 37-[redacted] weeks EGA  Problem: Phase II Progression Outcomes Goal: Pushing aids - rope, bar, perineal massage Outcome: Completed/Met Date Met:  02/27/15 Hot compress  On perineum

## 2015-02-27 NOTE — Progress Notes (Signed)
Post Partum Day 1 Subjective: no complaints  Objective: Blood pressure 111/58, pulse 82, temperature 98 F (36.7 C), temperature source Oral, resp. rate 18, height 5\' 5"  (1.651 m), weight 255 lb (115.667 kg), last menstrual period 06/01/2014, unknown if currently breastfeeding.  Physical Exam:  General: alert Lochia: appropriate Uterine Fundus: firm Incision: healing well DVT Evaluation: No evidence of DVT seen on physical exam.   Recent Labs  02/26/15 0530 02/27/15 0555  HGB 13.1 11.3*  HCT 38.7 33.5*    Assessment/Plan: Plan for discharge tomorrow   LOS: 1 day   Candido Flott M 02/27/2015, 9:18 AM

## 2015-02-28 NOTE — Discharge Summary (Signed)
Obstetric Discharge Summary Reason for Admission: onset of labor Prenatal Procedures: none Intrapartum Procedures: spontaneous vaginal delivery Postpartum Procedures: none Complications-Operative and Postpartum: none HEMOGLOBIN  Date Value Ref Range Status  02/27/2015 11.3* 12.0 - 15.0 g/dL Final   HCT  Date Value Ref Range Status  02/27/2015 33.5* 36.0 - 46.0 % Final    Physical Exam:  General: alert Lochia: appropriate Uterine Fundus: firm Incision: healing well DVT Evaluation: No evidence of DVT seen on physical exam.  Discharge Diagnoses: Term Pregnancy-delivered  Discharge Information: Date: 02/28/2015 Activity: pelvic rest Diet: routine Medications: PNV Condition: stable Instructions: refer to practice specific booklet Discharge to: home Follow-up Information    Follow up with Meriel PicaHOLLAND,Simora Dingee M, MD. Schedule an appointment as soon as possible for a visit in 6 weeks.   Specialty:  Obstetrics and Gynecology   Contact information:   [address] [place] [id number] [phone]       Newborn Data: Live born female  Birth Weight: 7 lb (3175 g) APGAR: 9, 9  Home with mother.  Meriel PicaHOLLAND,Shakirah Kirkey M 02/28/2015, 9:31 AM

## 2018-03-15 ENCOUNTER — Emergency Department (HOSPITAL_BASED_OUTPATIENT_CLINIC_OR_DEPARTMENT_OTHER): Payer: BC Managed Care – PPO

## 2018-03-15 ENCOUNTER — Other Ambulatory Visit: Payer: Self-pay

## 2018-03-15 ENCOUNTER — Encounter (HOSPITAL_BASED_OUTPATIENT_CLINIC_OR_DEPARTMENT_OTHER): Payer: Self-pay | Admitting: *Deleted

## 2018-03-15 ENCOUNTER — Emergency Department (HOSPITAL_BASED_OUTPATIENT_CLINIC_OR_DEPARTMENT_OTHER)
Admission: EM | Admit: 2018-03-15 | Discharge: 2018-03-15 | Disposition: A | Discharge status: Home / Self Care | Payer: BC Managed Care – PPO | Attending: Physician Assistant | Admitting: Physician Assistant

## 2018-03-15 DIAGNOSIS — M7918 Myalgia, other site: Secondary | ICD-10-CM

## 2018-03-15 DIAGNOSIS — S93401A Sprain of unspecified ligament of right ankle, initial encounter: Secondary | ICD-10-CM | POA: Diagnosis not present

## 2018-03-15 DIAGNOSIS — Y92410 Unspecified street and highway as the place of occurrence of the external cause: Secondary | ICD-10-CM | POA: Diagnosis not present

## 2018-03-15 DIAGNOSIS — Y939 Activity, unspecified: Secondary | ICD-10-CM | POA: Diagnosis not present

## 2018-03-15 DIAGNOSIS — R0789 Other chest pain: Secondary | ICD-10-CM | POA: Diagnosis not present

## 2018-03-15 DIAGNOSIS — Y999 Unspecified external cause status: Secondary | ICD-10-CM | POA: Insufficient documentation

## 2018-03-15 DIAGNOSIS — S1083XA Contusion of other specified part of neck, initial encounter: Secondary | ICD-10-CM | POA: Insufficient documentation

## 2018-03-15 DIAGNOSIS — S1980XA Other specified injuries of unspecified part of neck, initial encounter: Secondary | ICD-10-CM

## 2018-03-15 DIAGNOSIS — S199XXA Unspecified injury of neck, initial encounter: Secondary | ICD-10-CM | POA: Diagnosis present

## 2018-03-15 LAB — CBC
HEMATOCRIT: 43.4 % (ref 36.0–46.0)
Hemoglobin: 14.9 g/dL (ref 12.0–15.0)
MCH: 28.8 pg (ref 26.0–34.0)
MCHC: 34.3 g/dL (ref 30.0–36.0)
MCV: 83.9 fL (ref 78.0–100.0)
PLATELETS: 327 10*3/uL (ref 150–400)
RBC: 5.17 MIL/uL — ABNORMAL HIGH (ref 3.87–5.11)
RDW: 13.8 % (ref 11.5–15.5)
WBC: 11.8 10*3/uL — AB (ref 4.0–10.5)

## 2018-03-15 LAB — BASIC METABOLIC PANEL
ANION GAP: 8 (ref 5–15)
BUN: 12 mg/dL (ref 6–20)
CALCIUM: 8.7 mg/dL — AB (ref 8.9–10.3)
CO2: 23 mmol/L (ref 22–32)
CREATININE: 0.68 mg/dL (ref 0.44–1.00)
Chloride: 104 mmol/L (ref 101–111)
Glucose, Bld: 93 mg/dL (ref 65–99)
Potassium: 3.6 mmol/L (ref 3.5–5.1)
SODIUM: 135 mmol/L (ref 135–145)

## 2018-03-15 LAB — PREGNANCY, URINE: Preg Test, Ur: NEGATIVE

## 2018-03-15 MED ORDER — KETOROLAC TROMETHAMINE 30 MG/ML IJ SOLN
30.0000 mg | Freq: Once | INTRAMUSCULAR | Status: AC
  Administered 2018-03-15: 30 mg via INTRAVENOUS
  Filled 2018-03-15: qty 1

## 2018-03-15 MED ORDER — IOPAMIDOL (ISOVUE-370) INJECTION 76%
100.0000 mL | Freq: Once | INTRAVENOUS | Status: AC | PRN
  Administered 2018-03-15: 100 mL via INTRAVENOUS

## 2018-03-15 MED ORDER — METHOCARBAMOL 500 MG PO TABS: 500.0000 mg | ORAL_TABLET | Freq: Two times a day (BID) | ORAL | 0 refills | Status: DC

## 2018-03-15 MED FILL — METHOCARBAMOL 500 MG TABLET: 500 | 10 days supply | Qty: 20 | Fill #0

## 2018-03-15 NOTE — Discharge Instructions (Addendum)
Your evaluation today is very reassuring, imaging shows no evidence of fracture of ankle, neck or chest or acute vascular injury in the neck.  The pain your experiencing is likely due to muscle strain and sprain of your right ankle, please use brace and crutches and ice and elevate your ankle as much as possible. You may take Ibuprofen and Robaxin as needed for pain management. Do not combine with any pain reliever other than tylenol. The muscle soreness should improve over the next week. Follow up with your family doctor in the next week for a recheck if you are still having symptoms. If ankle is not improving follow up with Dr. Norton BlizzardShane Hudnall. Return to ED if pain is worsening, you develop weakness or numbness of extremities, or new or concerning symptoms develop.
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
## 2018-03-15 NOTE — ED Notes (Signed)
Pt. Just left for Radiology, will continue with orders after Pt. Returns.

## 2018-04-05 ENCOUNTER — Ambulatory Visit: Payer: BC Managed Care – PPO | Admitting: Family Medicine

## 2018-04-05 ENCOUNTER — Encounter

## 2018-07-02 LAB — OB RESULTS CONSOLE RPR: RPR: NONREACTIVE

## 2018-07-02 LAB — OB RESULTS CONSOLE HIV ANTIBODY (ROUTINE TESTING): HIV: NONREACTIVE

## 2018-07-02 LAB — OB RESULTS CONSOLE ABO/RH: RH Type: POSITIVE

## 2018-07-02 LAB — OB RESULTS CONSOLE HEPATITIS B SURFACE ANTIGEN: Hepatitis B Surface Ag: NEGATIVE

## 2018-07-02 LAB — OB RESULTS CONSOLE GC/CHLAMYDIA: GC PROBE AMP, GENITAL: NEGATIVE

## 2018-07-02 LAB — OB RESULTS CONSOLE ANTIBODY SCREEN: Antibody Screen: NEGATIVE

## 2018-07-02 LAB — OB RESULTS CONSOLE RUBELLA ANTIBODY, IGM: Rubella: IMMUNE

## 2018-07-05 LAB — OB RESULTS CONSOLE GC/CHLAMYDIA: CHLAMYDIA, DNA PROBE: NEGATIVE

## 2018-11-27 NOTE — L&D Delivery Note (Signed)
Delivery Note At 8:17 PM a viable female was delivered via Vaginal, Spontaneous (Presentation: LOA).  APGAR: 9, 9; weight pending.  Placenta status: S, I. 3V Cord with the following complications: none.  Cord pH: n/a  Anesthesia: 1% lidocaine   Episiotomy: None Lacerations: 2nd degree Suture Repair: 3.0 vicryl rapide Est. Blood Loss (mL): 100  Mom to postpartum.  Baby to Couplet care / Skin to Skin.  Linda Norton 02/02/2019, 8:45 PM

## 2019-02-02 ENCOUNTER — Inpatient Hospital Stay (HOSPITAL_COMMUNITY)
Admission: AD | Admit: 2019-02-02 | Discharge: 2019-02-04 | DRG: 807 | Disposition: A | Discharge status: Home / Self Care | Payer: BC Managed Care – PPO | Attending: Obstetrics & Gynecology | Admitting: Obstetrics & Gynecology

## 2019-02-02 ENCOUNTER — Encounter (HOSPITAL_COMMUNITY): Payer: Self-pay | Admitting: *Deleted

## 2019-02-02 ENCOUNTER — Other Ambulatory Visit: Payer: Self-pay

## 2019-02-02 DIAGNOSIS — Z3490 Encounter for supervision of normal pregnancy, unspecified, unspecified trimester: Secondary | ICD-10-CM

## 2019-02-02 DIAGNOSIS — Z3A38 38 weeks gestation of pregnancy: Secondary | ICD-10-CM | POA: Diagnosis not present

## 2019-02-02 DIAGNOSIS — O26893 Other specified pregnancy related conditions, third trimester: Secondary | ICD-10-CM | POA: Diagnosis present

## 2019-02-02 HISTORY — DX: Encounter for supervision of normal pregnancy, unspecified, unspecified trimester: Z34.90

## 2019-02-02 LAB — TYPE AND SCREEN
ABO/RH(D): O POS
Antibody Screen: NEGATIVE

## 2019-02-02 LAB — ABO/RH: ABO/RH(D): O POS

## 2019-02-02 LAB — CBC
HEMATOCRIT: 38.6 % (ref 36.0–46.0)
Hemoglobin: 12.5 g/dL (ref 12.0–15.0)
MCH: 27.2 pg (ref 26.0–34.0)
MCHC: 32.4 g/dL (ref 30.0–36.0)
MCV: 83.9 fL (ref 80.0–100.0)
Platelets: 219 10*3/uL (ref 150–400)
RBC: 4.6 MIL/uL (ref 3.87–5.11)
RDW: 15.5 % (ref 11.5–15.5)
WBC: 10.2 10*3/uL (ref 4.0–10.5)
nRBC: 0 % (ref 0.0–0.2)

## 2019-02-02 LAB — RPR: RPR Ser Ql: NONREACTIVE

## 2019-02-02 MED ORDER — FLEET ENEMA 7-19 GM/118ML RE ENEM: 1.0000 | ENEMA | RECTAL | Status: DC | PRN

## 2019-02-02 MED ORDER — ACETAMINOPHEN 325 MG PO TABS: 650.0000 mg | ORAL_TABLET | ORAL | Status: DC | PRN

## 2019-02-02 MED ORDER — DIPHENHYDRAMINE HCL 25 MG PO CAPS: 25.0000 mg | ORAL_CAPSULE | Freq: Four times a day (QID) | ORAL | Status: DC | PRN

## 2019-02-02 MED ORDER — SOD CITRATE-CITRIC ACID 500-334 MG/5ML PO SOLN: 30.0000 mL | ORAL | Status: DC | PRN

## 2019-02-02 MED ORDER — OXYTOCIN 10 UNIT/ML IJ SOLN
INTRAMUSCULAR | Status: AC
  Administered 2019-02-02: 10 [IU] via INTRAMUSCULAR
  Filled 2019-02-02: qty 1

## 2019-02-02 MED ORDER — OXYCODONE-ACETAMINOPHEN 5-325 MG PO TABS: 1.0000 | ORAL_TABLET | ORAL | Status: DC | PRN

## 2019-02-02 MED ORDER — DIBUCAINE 1 % RE OINT: 1.0000 "application " | TOPICAL_OINTMENT | RECTAL | Status: DC | PRN

## 2019-02-02 MED ORDER — WITCH HAZEL-GLYCERIN EX PADS: 1.0000 "application " | MEDICATED_PAD | CUTANEOUS | Status: DC | PRN

## 2019-02-02 MED ORDER — ONDANSETRON HCL 4 MG/2ML IJ SOLN: 4.0000 mg | INTRAMUSCULAR | Status: DC | PRN

## 2019-02-02 MED ORDER — OXYTOCIN 10 UNIT/ML IJ SOLN
10.0000 [IU] | Freq: Once | INTRAMUSCULAR | Status: AC
  Administered 2019-02-02: 10 [IU] via INTRAMUSCULAR

## 2019-02-02 MED ORDER — LIDOCAINE HCL (PF) 1 % IJ SOLN
30.0000 mL | INTRAMUSCULAR | Status: DC | PRN
  Administered 2019-02-02: 30 mL via SUBCUTANEOUS
  Filled 2019-02-02 (×2): qty 30

## 2019-02-02 MED ORDER — LACTATED RINGERS IV SOLN: 500.0000 mL | INTRAVENOUS | Status: DC | PRN

## 2019-02-02 MED ORDER — SIMETHICONE 80 MG PO CHEW: 80.0000 mg | CHEWABLE_TABLET | ORAL | Status: DC | PRN

## 2019-02-02 MED ORDER — SENNOSIDES-DOCUSATE SODIUM 8.6-50 MG PO TABS
2.0000 | ORAL_TABLET | ORAL | Status: DC
  Administered 2019-02-02 – 2019-02-04 (×2): 2 via ORAL
  Filled 2019-02-02 (×2): qty 2

## 2019-02-02 MED ORDER — PRENATAL MULTIVITAMIN CH
1.0000 | ORAL_TABLET | Freq: Every day | ORAL | Status: DC
  Administered 2019-02-03: 1 via ORAL
  Filled 2019-02-02: qty 1

## 2019-02-02 MED ORDER — FLUTICASONE PROPIONATE 50 MCG/ACT NA SUSP
1.0000 | Freq: Every day | NASAL | Status: DC
  Filled 2019-02-02: qty 16

## 2019-02-02 MED ORDER — OXYTOCIN 40 UNITS IN NORMAL SALINE INFUSION - SIMPLE MED
2.5000 [IU]/h | INTRAVENOUS | Status: DC
  Filled 2019-02-02: qty 1000

## 2019-02-02 MED ORDER — COCONUT OIL OIL: 1.0000 "application " | TOPICAL_OIL | Status: DC | PRN

## 2019-02-02 MED ORDER — ONDANSETRON HCL 4 MG PO TABS: 4.0000 mg | ORAL_TABLET | ORAL | Status: DC | PRN

## 2019-02-02 MED ORDER — IBUPROFEN 600 MG PO TABS
600.0000 mg | ORAL_TABLET | Freq: Four times a day (QID) | ORAL | Status: DC
  Administered 2019-02-02 – 2019-02-04 (×6): 600 mg via ORAL
  Filled 2019-02-02 (×6): qty 1

## 2019-02-02 MED ORDER — TETANUS-DIPHTH-ACELL PERTUSSIS 5-2.5-18.5 LF-MCG/0.5 IM SUSP: 0.5000 mL | Freq: Once | INTRAMUSCULAR | Status: DC

## 2019-02-02 MED ORDER — OXYCODONE-ACETAMINOPHEN 5-325 MG PO TABS: 2.0000 | ORAL_TABLET | ORAL | Status: DC | PRN

## 2019-02-02 MED ORDER — FENTANYL CITRATE (PF) 100 MCG/2ML IJ SOLN: 50.0000 ug | INTRAMUSCULAR | Status: DC | PRN

## 2019-02-02 MED ORDER — ONDANSETRON HCL 4 MG/2ML IJ SOLN: 4.0000 mg | Freq: Four times a day (QID) | INTRAMUSCULAR | Status: DC | PRN

## 2019-02-02 MED ORDER — ACETAMINOPHEN 325 MG PO TABS
650.0000 mg | ORAL_TABLET | ORAL | Status: DC | PRN
  Administered 2019-02-02 – 2019-02-03 (×2): 650 mg via ORAL
  Filled 2019-02-02 (×2): qty 2

## 2019-02-02 MED ORDER — LACTATED RINGERS IV SOLN: INTRAVENOUS | Status: DC

## 2019-02-02 MED ORDER — OXYTOCIN BOLUS FROM INFUSION: 500.0000 mL | Freq: Once | INTRAVENOUS | Status: DC

## 2019-02-02 MED ORDER — ZOLPIDEM TARTRATE 5 MG PO TABS: 5.0000 mg | ORAL_TABLET | Freq: Every evening | ORAL | Status: DC | PRN

## 2019-02-02 MED ORDER — BENZOCAINE-MENTHOL 20-0.5 % EX AERO
1.0000 "application " | INHALATION_SPRAY | CUTANEOUS | Status: DC | PRN
  Filled 2019-02-02: qty 56

## 2019-02-02 MED ORDER — ALBUTEROL SULFATE (2.5 MG/3ML) 0.083% IN NEBU: 3.0000 mL | INHALATION_SOLUTION | Freq: Four times a day (QID) | RESPIRATORY_TRACT | Status: DC | PRN

## 2019-02-02 NOTE — Progress Notes (Signed)
Patient requested cervix check. SVE 7/90/-2 Declines AROM and pitocin  Mitchel Honour, DO

## 2019-02-02 NOTE — MAU Note (Signed)
Ctxs since 2200. Denies LOF. Some bloody show. 2cm last sve

## 2019-02-02 NOTE — Progress Notes (Signed)
Patient is w/o complaint.  SVE unchanged Patient now accepts AROM AROM, clear Continue to monitor  Mitchel Honour, DO

## 2019-02-02 NOTE — Anesthesia Pain Management Evaluation Note (Signed)
  CRNA Pain Management Visit Note  Patient: Linda Norton, 38 y.o., female  "Hello I am a member of the anesthesia team at [facility] and [employer]. We have an anesthesia team available at all times to provide care throughout the hospital, including epidural management and anesthesia for C-section. I don't know your plan for the delivery whether it a natural birth, water birth, IV sedation, nitrous supplementation, doula or epidural, but we want to meet your pain goals."   1.Was your pain managed to your expectations on prior hospitalizations?   Yes   2.What is your expectation for pain management during this hospitalization?     Labor support without medications  3.How can we help you reach that goal? Be available for emergencies.  Record the patient's initial score and the patient's pain goal.   Pain: 5  Pain Goal: 10 [facility] wants you to be able to say your pain was always managed very well.  Linda Norton 02/02/2019

## 2019-02-02 NOTE — H&P (Signed)
Linda Norton is a 38 y.o. female presenting for SOL.  Patient reports no VB or LOF.  Active FM.  Antepartum course uncomplicated.  GBS negative.  OB History    Gravida  2   Para  1   Term  1   Preterm  0   AB  0   Living  1     SAB  0   TAB  0   Ectopic  0   Multiple  0   Live Births  1          Past Medical History:  Diagnosis Date  . Exercise-induced asthma 2007   Past Surgical History:  Procedure Laterality Date  . FINGER FRACTURE SURGERY  2008   Right  . WISDOM TOOTH EXTRACTION  2004   Family History: family history includes Diabetes in her father and maternal grandmother; Heart disease in her mother; Hypertension in her mother; Miscarriages / Stillbirths in her mother; Obesity in her brother, daughter, father, maternal aunt, maternal grandfather, maternal grandmother, maternal uncle, mother, paternal aunt, paternal grandfather, paternal grandmother, paternal uncle, sister, and son; Osteoporosis in her paternal grandmother; Stroke in her father and paternal grandmother. Social History:  reports that she has never smoked. She has never used smokeless tobacco. She reports that she does not drink alcohol or use drugs.     Maternal Diabetes: No Genetic Screening: Normal Maternal Ultrasounds/Referrals: Normal Fetal Ultrasounds or other Referrals:  None Maternal Substance Abuse:  No Significant Maternal Medications:  None Significant Maternal Lab Results:  Lab values include: Group B Strep negative Other Comments:  None  ROS Maternal Medical History:  Reason for admission: Contractions.   Contractions: Onset was 6-12 hours ago.   Frequency: regular.   Perceived severity is moderate.    Fetal activity: Perceived fetal activity is normal.   Last perceived fetal movement was within the past hour.    Prenatal complications: no prenatal complications Prenatal Complications - Diabetes: none.    Dilation: 6.5(6.5-7) Effacement (%): 90 Station:  0 Exam by:: Adah Perl RN Blood pressure 120/73, pulse 99, temperature 98.5 F (36.9 C), temperature source Oral, resp. rate 18, height 5\' 5"  (1.651 m), weight 122 kg, last menstrual period 02/20/2018, SpO2 99 %, unknown if currently breastfeeding. Maternal Exam:  Uterine Assessment: Contraction strength is moderate.  Contraction frequency is regular.   Abdomen: Patient reports no abdominal tenderness. Fundal height is c/w dates.   Estimated fetal weight is 7#12.       Physical Exam  Constitutional: She is oriented to person, place, and time. She appears well-developed and well-nourished.  GI: Soft. There is no rebound and no guarding.  Neurological: She is alert and oriented to person, place, and time.  Skin: Skin is warm and dry.  Psychiatric: She has a normal mood and affect. Her behavior is normal.    Prenatal labs: ABO, Rh: --/--/O POS, O POS Performed at [facility], [address] [place], [place] [address]  [phone]) Antibody: NEG (03/08 0451) Rubella:  Immune RPR:   NR HBsAg:   NR HIV:   NR GBS:   negative  Assessment/Plan: 37yo G2P1001 at 38 weeks with labor -Patient has birth plan and declines pain control, IVF (saline locked), amniotomy, augmentation.   -Patient declines SVE -Anticipate NSVD   Mitchel Honour 02/02/2019, 6:36 AM

## 2019-02-02 NOTE — Progress Notes (Signed)
Linda Norton is a 38 y.o. G2P1001 at [redacted]w[redacted]d by ultrasound admitted for active labor  Subjective: No complaints  Objective: BP (!) 136/93   Pulse 93   Temp 97.9 F (36.6 C) (Oral)   Resp 16   Ht 5\' 5"  (1.651 m)   Wt 122 kg   LMP 02/20/2018   SpO2 99%   BMI 44.76 kg/m  No intake/output data recorded. No intake/output data recorded.  FHT:  FHR: 135 bpm, variability: moderate,  accelerations:  Present,  decelerations:  Absent UC:   regular, every 3-5 minutes SVE:   Dilation: 7 Effacement (%): 90 Station: -2 Exam by:: Dr Langston Masker  Labs: Lab Results  Component Value Date   WBC 10.2 02/02/2019   HGB 12.5 02/02/2019   HCT 38.6 02/02/2019   MCV 83.9 02/02/2019   PLT 219 02/02/2019    Assessment / Plan: Protracted active phase  Labor: protracted but patient refuses pitocin and AROM Preeclampsia:  n/a Fetal Wellbeing:  Category I Pain Control:  Labor support without medications I/D:  n/a Anticipated MOD:  NSVD  Mitchel Honour 02/02/2019, 3:39 PM

## 2019-02-03 LAB — CBC
HCT: 34.1 % — ABNORMAL LOW (ref 36.0–46.0)
Hemoglobin: 11 g/dL — ABNORMAL LOW (ref 12.0–15.0)
MCH: 27 pg (ref 26.0–34.0)
MCHC: 32.3 g/dL (ref 30.0–36.0)
MCV: 83.8 fL (ref 80.0–100.0)
Platelets: 200 10*3/uL (ref 150–400)
RBC: 4.07 MIL/uL (ref 3.87–5.11)
RDW: 15.6 % — AB (ref 11.5–15.5)
WBC: 11 10*3/uL — ABNORMAL HIGH (ref 4.0–10.5)
nRBC: 0 % (ref 0.0–0.2)

## 2019-02-03 MED ORDER — ACETAMINOPHEN FOR CIRCUMCISION 160 MG/5 ML
40.0000 mg | Freq: Once | ORAL | Status: DC
  Filled 2019-02-03: qty 1.25

## 2019-02-03 MED ORDER — LIDOCAINE 1% INJECTION FOR CIRCUMCISION
0.8000 mL | INJECTION | Freq: Once | INTRAVENOUS | Status: DC
  Filled 2019-02-03: qty 1

## 2019-02-03 MED ORDER — EPINEPHRINE TOPICAL FOR CIRCUMCISION 0.1 MG/ML
1.0000 [drp] | TOPICAL | Status: DC | PRN
  Filled 2019-02-03: qty 1

## 2019-02-03 MED ORDER — ACETAMINOPHEN FOR CIRCUMCISION 160 MG/5 ML
40.0000 mg | ORAL | Status: DC | PRN
  Filled 2019-02-03: qty 1.25

## 2019-02-03 MED ORDER — SUCROSE 24% NICU/PEDS ORAL SOLUTION: 0.5000 mL | OROMUCOSAL | Status: DC | PRN

## 2019-02-03 MED ORDER — WHITE PETROLATUM EX OINT: 1.0000 "application " | TOPICAL_OINTMENT | CUTANEOUS | Status: DC | PRN

## 2019-02-03 NOTE — Progress Notes (Signed)
Patient doing well. No complaints. BP 132/86 (BP Location: Left Arm)   Pulse 81   Temp 98.3 F (36.8 C) (Oral)   Resp 18   Ht 5\' 5"  (1.651 m)   Wt 122 kg   LMP 02/20/2018   SpO2 99%   BMI 44.76 kg/m  Results for orders placed or performed during the hospital encounter of 02/02/19 (from the past 24 hour(s))  CBC     Status: Abnormal   Collection Time: 02/03/19  4:49 AM  Result Value Ref Range   WBC 11.0 (H) 4.0 - 10.5 K/uL   RBC 4.07 3.87 - 5.11 MIL/uL   Hemoglobin 11.0 (L) 12.0 - 15.0 g/dL   HCT 58.0 (L) 99.8 - 33.8 %   MCV 83.8 80.0 - 100.0 fL   MCH 27.0 26.0 - 34.0 pg   MCHC 32.3 30.0 - 36.0 g/dL   RDW 25.0 (H) 53.9 - 76.7 %   Platelets 200 150 - 400 K/uL   nRBC 0.0 0.0 - 0.2 %   Abdomen is soft and non tender Lochia WNL  PPD # 1 Doing well Routine care Discharge home tomorrow

## 2019-02-03 NOTE — Lactation Note (Signed)
This note was copied from a baby's chart. Lactation Consultation Note  Patient Name: Linda Norton ZMOQH'U Date: 02/03/2019 Reason for consult: Initial assessment;Term P2 Baby is 64 hours old and latching frequently to breast.  Mom has a history of low milk supply with first baby.  She pumped and supplemented with formula for four months.  Mom is hoping supply will be better with this baby.  She reports no breast changes with pregnancy.  Instructed on hand expression and 2 small drops visible.  Assisted with cross cradle hold on left and football hold on right.  Baby opens wide and latches easily and well. Baby tends to get sleepy on breast and waking techniques reviewed. Mom has carpal tunnel in her hands so somewhat difficult to support baby.  She is using a Boppy but I recommended using two hospital pillows. Recommended mom start doing some post pumping for stimulation due to history of low supply.  Mom agreeable.  Symphony pump set up.  Instructions given on use and cleaning.  Also instructed to do hand expression every few hours.  Colostrum containers given.  Breastfeeding consultation services and support information given.  Encouraged to call out for assist/concerns prn.  Maternal Data Formula Feeding for Exclusion: No Has patient been taught Hand Expression?: Yes Does the patient have breastfeeding experience prior to this delivery?: Yes  Feeding Feeding Type: Breast Fed  LATCH Score Latch: Grasps breast easily, tongue down, lips flanged, rhythmical sucking.  Audible Swallowing: A few with stimulation  Type of Nipple: Everted at rest and after stimulation  Comfort (Breast/Nipple): Soft / non-tender  Hold (Positioning): Assistance needed to correctly position infant at breast and maintain latch.  LATCH Score: 8  Interventions Interventions: Breast feeding basics reviewed;Assisted with latch;Breast compression;Skin to skin;Adjust position;Breast massage;Support pillows;Hand  express;Position options;DEBP  Lactation Tools Discussed/Used Pump Review: Setup, frequency, and cleaning;Milk Storage Initiated by:: Lmoulden Date initiated:: 02/03/19   Consult Status Consult Status: Follow-up Date: 02/04/19 Follow-up type: In-patient    Huston Foley 02/03/2019, 3:02 PM

## 2019-02-03 NOTE — Lactation Note (Signed)
This note was copied from a baby's chart. Lactation Consultation Note  Patient Name: Girl Brene Sessions MHWKG'[id number] Date: 02/03/2019 Reason for consult: Initial assessment;Term;Primapara;1st time breastfeeding  P1 mother whose infant is now 32 hours old.  RN in room doing assessment and mother was expecting grandparents to visit soon.  She requested I return after grandparents leave and will let her RN know when to call me.  I am happy to return for my first visit when it is convenient for the family.   Maternal Data Formula Feeding for Exclusion: No Has patient been taught Hand Expression?: Yes Does the patient have breastfeeding experience prior to this delivery?: No  Feeding    LATCH Score                   Interventions    Lactation Tools Discussed/Used     Consult Status Consult Status: Follow-up Date: 02/04/19 Follow-up type: In-patient    Antavious Spanos R Tulsi Crossett 02/03/2019, 11:28 AM

## 2019-02-04 ENCOUNTER — Encounter (HOSPITAL_COMMUNITY): Payer: Self-pay | Admitting: *Deleted

## 2019-02-04 NOTE — Discharge Summary (Signed)
Obstetric Discharge Summary Reason for Admission: onset of labor Prenatal Procedures: none Intrapartum Procedures: spontaneous vaginal delivery Postpartum Procedures: none Complications-Operative and Postpartum: 2 degree perineal laceration Hemoglobin  Date Value Ref Range Status  02/03/2019 11.0 (L) 12.0 - 15.0 g/dL Final   Hemoglobin, fingerstick  Date Value Ref Range Status  08/13/2013 14.1 12.0 - 16.0 g/dL Final   HCT  Date Value Ref Range Status  02/03/2019 34.1 (L) 36.0 - 46.0 % Final    Physical Exam:  General: alert, cooperative, appears stated age and no distress Lochia: appropriate Uterine Fundus: firm Incision: healing well DVT Evaluation: No evidence of DVT seen on physical exam.  Discharge Diagnoses: Term Pregnancy-delivered  Discharge Information: Date: 02/04/2019 Activity: pelvic rest Diet: routine Medications: None Condition: stable Instructions: refer to practice specific booklet Discharge to: home   Newborn Data: Live born female  Birth Weight: 9 lb 4.3 oz (4204 g) APGAR: 9, 9  Newborn Delivery   Birth date/time:  02/02/2019 20:17:00 Delivery type:  Vaginal, Spontaneous     Home with mother.  Linda Norton 02/04/2019, 8:48 AM

## 2019-02-04 NOTE — Lactation Note (Signed)
This note was copied from a baby's chart. Lactation Consultation Note  Patient Name: Linda Norton [id number]'U Date: 02/04/2019 Reason for consult: Follow-up assessment;Term Baby is 37 hours old/5% weight loss.  Output WNL.  Baby has been cluster feeding.  Mom has pumped once.  Follow up at pediatricians tomorrow.  Mom is feeling good about feedings.  No questions or concerns.  Lactation outpatient services and support reviewed and encouraged prn.  Maternal Data    Feeding Feeding Type: Breast Fed  LATCH Score                   Interventions    Lactation Tools Discussed/Used     Consult Status Consult Status: Complete Follow-up type: Call as needed    Huston Foley 02/04/2019, 9:42 AM
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
## 2019-10-01 ENCOUNTER — Other Ambulatory Visit: Payer: Self-pay

## 2019-10-01 DIAGNOSIS — Z20822 Contact with and (suspected) exposure to covid-19: Secondary | ICD-10-CM

## 2019-10-02 LAB — NOVEL CORONAVIRUS, NAA: SARS-CoV-2, NAA: NOT DETECTED

## 2020-01-09 ENCOUNTER — Ambulatory Visit: Payer: BC Managed Care – PPO

## 2020-01-10 ENCOUNTER — Ambulatory Visit: Payer: BC Managed Care – PPO

## 2020-01-24 ENCOUNTER — Ambulatory Visit: Payer: BC Managed Care – PPO | Attending: Internal Medicine

## 2020-01-24 DIAGNOSIS — Z23 Encounter for immunization: Secondary | ICD-10-CM | POA: Insufficient documentation

## 2020-01-24 NOTE — Progress Notes (Signed)
   Covid-19 Vaccination Clinic  Name:  Linda Norton    MRN: [id number] DOB: 05/11/1981  01/24/2020  [name] was observed post Covid-19 immunization for 15 minutes without incidence. She was provided with Vaccine Information Sheet and instruction to access the V-Safe system.   [name] was instructed to call 911 with any severe reactions post vaccine: Marland Kitchen Difficulty breathing  . Swelling of your face and throat  . A fast heartbeat  . A bad rash all over your body  . Dizziness and weakness    Immunizations Administered    Name Date Dose VIS Date Route   Pfizer COVID-19 Vaccine 01/24/2020  6:08 PM 0.3 mL 11/07/2019 Intramuscular   Manufacturer: [employer], [employer]   Lot: [id number]   NDC: [address]-5

## 2020-02-14 ENCOUNTER — Ambulatory Visit: Payer: BC Managed Care – PPO | Attending: Internal Medicine

## 2020-02-14 DIAGNOSIS — Z23 Encounter for immunization: Secondary | ICD-10-CM

## 2020-02-14 NOTE — Progress Notes (Signed)
   Covid-19 Vaccination Clinic  Name:  Linda Norton    MRN: [id number] DOB: [birth year]  02/14/2020  [name] was observed post Covid-19 immunization for 15 minutes without incident. She was provided with Vaccine Information Sheet and instruction to access the V-Safe system.   [name] was instructed to call 911 with any severe reactions post vaccine: Marland Kitchen Difficulty breathing  . Swelling of face and throat  . A fast heartbeat  . A bad rash all over body  . Dizziness and weakness   Immunizations Administered    Name Date Dose VIS Date Route   Pfizer COVID-19 Vaccine 02/14/2020 10:02 AM 0.3 mL 11/07/2019 Intramuscular   Manufacturer: [employer], [employer]   Lot: [id number]   NDC: [address]-2

## 2020-09-02 ENCOUNTER — Institutional Professional Consult (permissible substitution): Payer: BC Managed Care – PPO | Admitting: Plastic Surgery

## 2023-06-21 ENCOUNTER — Ambulatory Visit: Payer: BC Managed Care – PPO | Admitting: Orthopaedic Surgery

## 2023-06-21 ENCOUNTER — Encounter: Payer: Self-pay | Admitting: Orthopaedic Surgery

## 2023-06-21 DIAGNOSIS — R2242 Localized swelling, mass and lump, left lower limb: Secondary | ICD-10-CM

## 2023-06-21 NOTE — Progress Notes (Signed)
The patient is a 42 year old female who is sent to [place] from dermatology to evaluate and treat a slowly growing left lower leg mass that she has noticed for several years now but states that the last year or 2 she feels like it is getting a little bit bigger.  She denies any trauma.  It does not wake her up at night.  It is down near the the ankle on the anterior lateral aspect of the leg.  She has never had any infection in this area or surgery in this area at all.  On exam when were having her lay supine there is a slight asymmetry on that left lower aspect of her leg on the left side when comparing both sides.  There is a palpable fullness of the soft tissue on this area that is not present on the other area.  There is no evidence infection and no induration.  I did place an 18-gauge needle in the area and did not get any fluid out of this.  Her motor and sensory exam are normal and there is no Tinel's sign over this area.  Given the concern about this growing over the last several months, a MRI with contrast is warranted to assess the soft tissue of the left lower extremity to make sure this is not having any worrisome features.  She would like this done as well and I agree given the fact that it does seem to be growing.  We will see her back in follow-up once we have this MRI.  All questions and concerns were addressed and answered.

## 2023-06-22 ENCOUNTER — Other Ambulatory Visit: Payer: Self-pay

## 2023-06-22 DIAGNOSIS — R2242 Localized swelling, mass and lump, left lower limb: Secondary | ICD-10-CM

## 2023-07-05 ENCOUNTER — Other Ambulatory Visit: Payer: BC Managed Care – PPO

## 2023-07-08 ENCOUNTER — Other Ambulatory Visit: Payer: BC Managed Care – PPO

## 2023-07-31 ENCOUNTER — Inpatient Hospital Stay: Admission: RE | Admit: 2023-07-31 | Payer: BC Managed Care – PPO | Source: Ambulatory Visit

## 2023-08-27 ENCOUNTER — Other Ambulatory Visit: Payer: Self-pay

## 2023-08-27 DIAGNOSIS — G44209 Tension-type headache, unspecified, not intractable: Secondary | ICD-10-CM | POA: Insufficient documentation

## 2023-08-27 DIAGNOSIS — J45909 Unspecified asthma, uncomplicated: Secondary | ICD-10-CM | POA: Insufficient documentation

## 2023-08-27 DIAGNOSIS — R000 Tachycardia, unspecified: Secondary | ICD-10-CM | POA: Insufficient documentation

## 2023-08-30 ENCOUNTER — Ambulatory Visit: Payer: BC Managed Care – PPO

## 2023-09-11 ENCOUNTER — Ambulatory Visit (INDEPENDENT_AMBULATORY_CARE_PROVIDER_SITE_OTHER): Payer: BC Managed Care – PPO

## 2023-09-11 ENCOUNTER — Ambulatory Visit: Payer: BC Managed Care – PPO | Admitting: Podiatry

## 2023-09-11 DIAGNOSIS — M7672 Peroneal tendinitis, left leg: Secondary | ICD-10-CM | POA: Diagnosis not present

## 2023-09-11 DIAGNOSIS — G5782 Other specified mononeuropathies of left lower limb: Secondary | ICD-10-CM

## 2023-09-11 DIAGNOSIS — M79672 Pain in left foot: Secondary | ICD-10-CM | POA: Diagnosis not present

## 2023-09-11 MED ORDER — TRIAMCINOLONE ACETONIDE 10 MG/ML IJ SUSP
10.0000 mg | Freq: Once | INTRAMUSCULAR | Status: AC
  Administered 2023-09-11: 10 mg via INTRAMUSCULAR

## 2023-09-11 NOTE — Progress Notes (Unsigned)
   HPI: 42 y.o. female presents today with c/o pain at the left lateral midfoot.  Present for 4-6 months.  Pain is not dependent on whether she is weight-bearing or not.  It is not dependent on activity levels.  Denies bruising or current antalgic gait.  Past Medical History:  Diagnosis Date   Asthma    Exercise-induced asthma 11/27/2005   Normal labor 02/02/2019   Pregnancy 02/02/2019   Racing heart beat    Tension headache     Past Surgical History:  Procedure Laterality Date   FINGER FRACTURE SURGERY  2008   Right   WISDOM TOOTH EXTRACTION  2004    Allergies  Allergen Reactions   Azithromycin Swelling and Rash    Physical Exam: General: The patient is alert and oriented x3 in no acute distress.  Dermatology: Skin is warm, dry and supple bilateral lower extremities. Interspaces are clear of maceration and debris.    Vascular: Palpable pedal pulses bilaterally. Capillary refill within normal limits.  No appreciable edema.  No erythema or calor.  Neurological: Light touch sensation grossly intact bilateral feet. + Tinel's sign of sural nerve at 5th met. Base area left foot.    Musculoskeletal Exam: Pain on palpation of peroneus brevis tendon at its insertion left 5th metatarsal base.  No ecchymosis noted.  Minimal to no pain with resisted eversion of left foot.  Radiographic Exam (left foot, 3wb views, 09/11/2023):  Normal osseous mineralization. Joint spaces preserved.  No fractures or periosteal reaction seen.  Mild first ray elevatus noted on lateral view.  Navicular - medial cuneiform fault seen on lateral view.  Decreased calcaneal inclination angle.   Assessment/Plan of Care: 1. Peroneus brevis tendinitis, left   2. Left foot pain   3. Sural neuritis, left     Meds ordered this encounter  Medications   triamcinolone acetonide (KENALOG) 10 MG/ML injection 10 mg   Discussed clinical findings with patient today.  Recommended a cortisone injection for left PB  tendinitis and sural neuritis.  With the patient's verbal consent, a corticosteroid injection was adminstered to the left 5th metatarsal base to incorporate the medicine into PB tendon as well as the sural nerve in this area.  This consisted of a mixture of 1% lidocaine plain, 0.5% sensorcaine plain, and Kenalog-10 for a total of 1.25cc's administered.  Bandaid applied. Patient tolerated this well.     She was fitted for an elastic ankle sleeve to wear during wb activities for additional support.    F/u 2-4 weeks or prn    Clerance Lav, DPM, FACFAS [facility]     [address] [place], [place] [address]                Office [phone]  Fax [phone]

## 2023-10-22 ENCOUNTER — Ambulatory Visit: Payer: BC Managed Care – PPO

## 2023-10-22 VITALS — BP 114/74 | HR 86 | Ht 65.0 in | Wt 252.1 lb

## 2023-10-22 DIAGNOSIS — R002 Palpitations: Secondary | ICD-10-CM | POA: Diagnosis not present

## 2023-10-22 NOTE — Assessment & Plan Note (Signed)
Infrequent symptoms. No dangerous signs. No significant abnormalities on physical exam today.  She does have an apple smart watch. Advised to monitor if she has symptoms with them electrogram recording and forwarded to [place] on MyChart.  Given her infrequent symptoms and unlikely nature to capture it on heart monitor over 2 to 4-week.,  Will hold off on further testing.  Advised to avoid stimulants such as caffeinated drinks. Keep yourself well-hydrated.  Will obtain a thyroid panel. Advised to continue with dietary modifications and increase activity to target weight loss and continue with regular exercise.

## 2023-10-22 NOTE — Progress Notes (Signed)
Cardiology Consultation:    Date:  10/22/2023   ID:  Antiana Vause, DOB March 20, 1981, MRN [id number]  PCP:  Sigmund Hazel, MD  Cardiologist:  Marlyn Corporal Sherlonda Flater, MD   Referring MD: Inez Pilgrim, NP   No chief complaint on file.    ASSESSMENT AND PLAN:   Ms. Tadesse 42 year old woman with no significant prior cardiac history. Has history of obesity, asthma, remote history of palpitations now with infrequent symptoms of palpitations occurring infrequently.  Not associated with any significant symptoms such as chest pain shortness of breath, syncope or near syncope.   Problem List Items Addressed This Visit     Palpitations - Primary    Infrequent symptoms. No dangerous signs. No significant abnormalities on physical exam today.  She does have an apple smart watch. Advised to monitor if she has symptoms with them electrogram recording and forwarded to [place] on MyChart.  Given her infrequent symptoms and unlikely nature to capture it on heart monitor over 2 to 4-week.,  Will hold off on further testing.  Advised to avoid stimulants such as caffeinated drinks. Keep yourself well-hydrated.  Will obtain a thyroid panel. Advised to continue with dietary modifications and increase activity to target weight loss and continue with regular exercise.      Relevant Orders   EKG 12-Lead (Completed)   Thyroid Panel With TSH      History of Present Illness:    Kaleiyah Cauley is a 42 y.o. female who is being seen today for the evaluation of palpitations at the request of Pridgen, Stasia Cavalier, NP.  Has history of obesity, asthma, with remote history of palpitations No significant prior cardiac health issues.  Here for the visit accompanied by her son.  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  Mentions over 15 to 20 years ago she had symptoms of palpitations while in college which described as occasional sensation of extra beat or a fast beat that subsided with  mindful breathing.  Symptoms lasted few minutes at a time.  Holter monitor around the time for 2 weeks noted no significant abnormalities as she did not feel any symptoms.  Over the last many months she has had symptoms where she felt similar sensation of extra beat or forceful heartbeat sensation that happens for an extended period of time and takes longer to subside with mindful breathing.  No associated symptoms of lightheadedness, dizziness, syncopal episodes.  No associated shortness of breath, chest pain. No remitting or relieving factors.  No association with position, time of the day observed. Very infrequent symptoms. No symptoms in the past 2 weeks.  She denies any significant weight change over the past year. No regular exercise but does walk regularly without any limitations.  Does not consume coffee. Drinks 1 or 2 diet sodas a day, typically Coke. No smoking. Occasional alcohol consumption.  EKG in clinic today shows sinus rhythm heart rate 86/min, normal PR interval 138 ms, QRS duration 82 ms, QTc 435 ms.  Prior EKG for comparison is from 2007.  Family history of father with congestive heart disease and diabetes and mother with hypertension. Older sister has some kind of history of arrhythmia, she is not aware of specifics  Last lab work available from 06/18/2023 hemoglobin 13.5, hematocrit 42, WBC 7.6, platelets 284 BUN 11, creatinine 0.73, EGFR 106 Sodium 137, potassium 4.6 Normal transaminases and alkaline phosphatase Lipid panel total cholesterol 142, triglycerides 72, HDL 45, LDL 83. No thyroid panel available.  Past Medical History:  Diagnosis Date   Asthma    Exercise-induced asthma 11/27/2005   Normal labor 02/02/2019   Pregnancy 02/02/2019   Racing heart beat    Tension headache     Past Surgical History:  Procedure Laterality Date   FINGER FRACTURE SURGERY  2008   Right   WISDOM TOOTH EXTRACTION  2004    Current Medications: Current Meds   Medication Sig   albuterol (PROAIR HFA) 108 (90 BASE) MCG/ACT inhaler Inhale 2 puffs into the lungs every 6 (six) hours as needed for wheezing.   cetirizine (ZYRTEC ALLERGY) 10 MG tablet Take 10 mg by mouth as needed for allergies or rhinitis.   fluticasone (FLONASE) 50 MCG/ACT nasal spray Place 1 spray into both nostrils daily.   HAILEY 24 FE 1-20 MG-MCG(24) tablet Take 1 tablet by mouth daily.     Allergies:   Azithromycin   Social History   Socioeconomic History   Marital status: Married    Spouse name: Not on file   Number of children: Not on file   Years of education: Not on file   Highest education level: Not on file  Occupational History   Not on file  Tobacco Use   Smoking status: Never   Smokeless tobacco: Never  Vaping Use   Vaping status: Never Used  Substance and Sexual Activity   Alcohol use: No   Drug use: No   Sexual activity: Yes    Partners: Male  Other Topics Concern   Not on file  Social History Narrative   Not on file   Social Determinants of Health   Financial Resource Strain: Not on file  Food Insecurity: Low Risk  (06/01/2023)   [facility]   Hunger Vital Sign    Worried About Running Out of Food in the Last Year: Never true    Ran Out of Food in the Last Year: Never true  Transportation Needs: Not on file  Physical Activity: Not on file  Stress: Not on file  Social Connections: Not on file     Family History: The patient's family history includes Diabetes in her father and maternal grandmother; Heart disease in her mother; Hypertension in her mother; Miscarriages / Stillbirths in her mother; Obesity in her brother, daughter, father, maternal aunt, maternal grandfather, maternal grandmother, maternal uncle, mother, paternal aunt, paternal grandfather, paternal grandmother, paternal uncle, sister, and son; Osteoporosis in her paternal grandmother; Stroke in her father and paternal grandmother. ROS:   Please see  the history of present illness.    All 14 point review of systems negative except as described per history of present illness.  EKGs/Labs/Other Studies Reviewed:    The following studies were reviewed today:   EKG:  EKG Interpretation Date/Time:  Monday October 22 2023 09:28:13 EST Ventricular Rate:  86 PR Interval:  138 QRS Duration:  82 QT Interval:  364 QTC Calculation: 435 R Axis:   81  Text Interpretation: Normal sinus rhythm with sinus arrhythmia Normal ECG When compared with ECG of 21-Jan-2006 21:14, T wave amplitude has decreased in Anterolateral leads Confirmed by Huntley Dec reddy [phone]) on 10/22/2023 10:03:24 AM    Recent Labs: No results found for requested labs within last 365 days.  Recent Lipid Panel No results found for: "CHOL", "TRIG", "HDL", "CHOLHDL", "VLDL", "LDLCALC", "LDLDIRECT"  Physical Exam:    VS:  BP 114/74   Pulse 86   Ht 5\' 5"  (1.651 m)   Wt 252 lb 1.3 oz (114.3 kg)  SpO2 97%   BMI 41.95 kg/m     Wt Readings from Last 3 Encounters:  10/22/23 252 lb 1.3 oz (114.3 kg)  02/02/19 269 lb (122 kg)  03/15/18 225 lb (102.1 kg)     GENERAL:  Well nourished, well developed in no acute distress NECK: No JVD; No carotid bruits CARDIAC: RRR, S1 and S2 present, no murmurs, no rubs, no gallops CHEST:  Clear to auscultation without rales, wheezing or rhonchi  Extremities: Pulses bilaterally symmetric with radial 2+  NEUROLOGIC:  Alert and oriented x 3  Medication Adjustments/Labs and Tests Ordered: Current medicines are reviewed at length with the patient today.  Concerns regarding medicines are outlined above.  Orders Placed This Encounter  Procedures   Thyroid Panel With TSH   EKG 12-Lead   No orders of the defined types were placed in this encounter.   Signed, Cecille Amsterdam, MD, MPH, [facility] 10/22/2023 10:24 AM    [facility]

## 2023-10-22 NOTE — Patient Instructions (Signed)
Medication Instructions:    Your physician recommends that you continue on your current medications as directed. Please refer to the Current Medication list given to you today.  *If you need a refill on your cardiac medications before your next appointment, please call your pharmacy*   Lab Work:  Your physician recommends that you get a thyroid panel done.  [facility] lab is located on the 3rd floor, [address]. Hours are M-F 8 am-4 pm, closed 11:30 am-1:00 pm. You do not need an appointment.  If you have labs (blood work) drawn today and your tests are completely normal, you will receive your results only by: MyChart Message (if you have MyChart) OR A paper copy in the mail If you have any lab test that is abnormal or we need to change your treatment, we will call you to review the results.   Testing/Procedures: None  Follow-Up: At [facility], you and your health needs are our priority.  As part of our continuing mission to provide you with exceptional heart care, we have created designated Provider Care Teams.  These Care Teams include your primary Cardiologist (physician) and Advanced Practice Providers (APPs -  Physician Assistants and Nurse Practitioners) who all work together to provide you with the care you need, when you need it.  We recommend signing up for the patient portal called "MyChart".  Sign up information is provided on this After Visit Summary.  MyChart is used to connect with patients for Virtual Visits (Telemedicine).  Patients are able to view lab/test results, encounter notes, upcoming appointments, etc.  Non-urgent messages can be sent to your provider as well.   To learn more about what you can do with MyChart, go to [email or web address].    Your next appointment:   Follow up as needed   Provider:   Huntley Dec, MD

## 2023-10-23 LAB — THYROID PANEL WITH TSH
Free Thyroxine Index: 1.7 (ref 1.2–4.9)
T3 Uptake Ratio: 21 % — ABNORMAL LOW (ref 24–39)
T4, Total: 8.2 ug/dL (ref 4.5–12.0)
TSH: 2.26 u[IU]/mL (ref 0.450–4.500)

## 2023-11-02 ENCOUNTER — Encounter: Payer: Self-pay | Admitting: Orthopaedic Surgery

## 2023-11-06 ENCOUNTER — Inpatient Hospital Stay
Admission: RE | Admit: 2023-11-06 | Discharge: 2023-11-06 | Discharge status: Home / Self Care | Payer: BC Managed Care – PPO | Source: Ambulatory Visit | Attending: Orthopaedic Surgery | Admitting: Orthopaedic Surgery

## 2023-11-06 DIAGNOSIS — R2242 Localized swelling, mass and lump, left lower limb: Secondary | ICD-10-CM

## 2023-11-06 MED ORDER — GADOPICLENOL 0.5 MMOL/ML IV SOLN
10.0000 mL | Freq: Once | INTRAVENOUS | Status: AC | PRN
  Administered 2023-11-06: 10 mL via INTRAVENOUS

## 2023-12-25 ENCOUNTER — Telehealth: Payer: Self-pay | Admitting: Emergency Medicine

## 2023-12-25 NOTE — Telephone Encounter (Signed)
Left Voicemail 1/28

## 2023-12-25 NOTE — Telephone Encounter (Signed)
-----   Message from Rayland R Madireddy sent at 12/23/2023 12:17 PM EST ----- Please inform her her thyroid function test after the last office visit showed no major abnormalities.  One of the studies called T3 uptake was slightly below the lower limits of normal.  This can happen with slightly overactive thyroid function.  However other parameters of thyroid function were within normal range.  No further action required other than continuing to follow-up with her PCP and would recommend checking up her thyroid function parameters at her next health visit with her PCP.  From cardiac standpoint no further workup recommended at this time.  She can follow-up with [place] on an as-needed basis.

## 2024-01-01 NOTE — Telephone Encounter (Signed)
LVM 01/01/2024

## 2024-01-03 ENCOUNTER — Encounter: Payer: Self-pay | Admitting: Emergency Medicine

## 2024-01-09 ENCOUNTER — Encounter: Payer: Self-pay | Admitting: Orthopaedic Surgery

## 2024-05-20 ENCOUNTER — Ambulatory Visit: Admitting: Podiatry

## 2024-05-20 ENCOUNTER — Encounter: Payer: Self-pay | Admitting: Podiatry

## 2024-05-20 DIAGNOSIS — M7672 Peroneal tendinitis, left leg: Secondary | ICD-10-CM | POA: Diagnosis not present

## 2024-05-20 NOTE — Progress Notes (Signed)
     Chief Complaint  Patient presents with   Foot Pain    RM5  RM5 Peroneus brevis tendinitis flare left foot/ last seen Oct 2024 for same problem/no treatment     HPI: 43 y.o. female presents today with concern of flareup of her left peroneus brevis tendinitis.  She denies any injury that caused it to flareup.  Denies any bruising or swelling.  She does have pain with prolonged walking towards the outside of the left midfoot.  Past Medical History:  Diagnosis Date   Asthma    Exercise-induced asthma 11/27/2005   Normal labor 02/02/2019   Pregnancy 02/02/2019   Racing heart beat    Tension headache    Past Surgical History:  Procedure Laterality Date   FINGER FRACTURE SURGERY  2008   Right   WISDOM TOOTH EXTRACTION  2004   Allergies  Allergen Reactions   Azithromycin Swelling and Rash    Physical Exam: Palpable pedal pulses.  There is pain on palpation at the insertion of the peroneus brevis at the fifth metatarsal base.  No appreciable edema is noted.  No significant pain is noted on palpation of the bone at the fifth metatarsal.  There is no pain of the peroneal tendons as they coursed around the lateral malleolus.  Resisted eversion of the foot aggravates the pain.  Epicritic sensation is intact  Assessment/Plan of Care: 1. Peroneus brevis tendinitis, left     With the patient's consent a corticosteroid injection was administered to the insertion point of the peroneus brevis near the fifth metatarsal base.  This consisted of a mixture of 1% lidocaine  plain, 0.5% Marcaine plain, and Kenalog  10 for total of 1.25 cc administered.  She tolerated this well and a Band-Aid was applied.  She can resume higher impact activities after 2 days.  If this continues to flareup or be an issue for the patient, I would recommend custom molded orthotics with a lateral wedge as well as physical therapy to address any musculoskeletal deficits or imbalances.  Follow-up as needed   Awanda CHARM Imperial, DPM, FACFAS [facility]     [address] [place], [place] [address]                Office [phone]  Fax [phone]

## 2024-09-29 ENCOUNTER — Encounter: Payer: Self-pay | Admitting: Radiology

## 2025-01-07 ENCOUNTER — Ambulatory Visit: Admitting: Podiatry
